# Patient Record
Sex: Male | Born: 1990 | Race: Black or African American | Hispanic: No | Marital: Single | Smoking: Current every day smoker
Health system: Southern US, Community
[De-identification: ages and names within clinical notes are randomized; demographics above are authoritative.]

---
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
## 2008-02-13 ENCOUNTER — Emergency Department (HOSPITAL_COMMUNITY): Admission: EM | Admit: 2008-02-13 | Discharge: 2008-02-13 | Payer: Self-pay | Admitting: Emergency Medicine

## 2009-04-28 ENCOUNTER — Emergency Department (HOSPITAL_COMMUNITY): Admission: EM | Admit: 2009-04-28 | Discharge: 2009-04-28 | Payer: Self-pay | Admitting: Emergency Medicine

## 2009-11-01 ENCOUNTER — Ambulatory Visit (HOSPITAL_COMMUNITY): Admission: RE | Admit: 2009-11-01 | Discharge: 2009-11-01 | Payer: Self-pay | Admitting: Family Medicine

## 2009-11-01 ENCOUNTER — Encounter: Payer: Self-pay | Admitting: Family Medicine

## 2009-11-01 ENCOUNTER — Ambulatory Visit: Payer: Self-pay | Admitting: Family Medicine

## 2009-11-01 DIAGNOSIS — N509 Disorder of male genital organs, unspecified: Secondary | ICD-10-CM | POA: Insufficient documentation

## 2009-11-01 LAB — CONVERTED CEMR LAB
Glucose, Urine, Semiquant: NEGATIVE
Specific Gravity, Urine: >=1.03
pH: 5.5

## 2010-04-04 NOTE — Assessment & Plan Note (Signed)
Summary: swollen scrotum,df   Vital Signs:  Patient profile:   20 year old male Height:      74 inches Weight:      136 pounds BMI:     17.52 Temp:     98.5 degrees F oral Pulse rate:   86 / minute BP sitting:   128 / 78  (left arm) Cuff size:   regular  Vitals Entered By: Jimmy Footman, CMA (November 01, 2009 9:41 AM) CC: swollen scrotum x1 week Is Patient Diabetic? No Pain Assessment Patient in pain? yes     Location: groin Intensity: 10 Type: sharp   CC:  swollen scrotum x1 week.  History of Present Illness: 1. Swollen scrutum - Been there for about a week - He thinks it started after he sat on his left testicle - The pain was a gradual onset and has been getting progressively worse - Pain now rated a 10/10 - Associated with scrotal swelling and tenderness - Any movement or touching hurts - Has been trying to take Ibuprofen but doesn't help  ROS: denies fevers, chills, n/v, penile discharge.  endorses some abdominal pain  Habits & Providers  Alcohol-Tobacco-Diet     Tobacco Status: current  Current Medications (verified): 1)  Doxycycline Hyclate 100 Mg Tabs (Doxycycline Hyclate) .Marland Kitchen.. 1 Tab By Mouth Twice A Day For 10 Days 2)  Hydrocodone-Acetaminophen 5-500 Mg Tabs (Hydrocodone-Acetaminophen) .Marland Kitchen.. 1 Tab By Mouth Every 6 Hours As Needed For Pain  Allergies: No Known Drug Allergies  Social History: Reviewed history from 05/21/2007 and no changes required. Attending [employer] G[employer] program.  Occasional marijauna use (none in past 8 months).  No alcohol or tobacco.  Sexually active with muliple partners.  Lives with mom, dad, and 2 sisters (born in 74 and 4). Enjoys football and basketballSmoking Status:  current  Physical Exam  General:  vitals reviewed.  no acute distress. Lungs:  normal respiratory effort and normal breath sounds.   Heart:  normal rate and regular rhythm.   Abdomen:  soft, non-tender, no distention, and no guarding.   Genitalia:  Left  testicle: grossly swollen compared to the right.  Very tender to palpation.  Boggy. Right testicle: normal Penis: normal Skin:  no rashes and no suspicious lesions.   Psych:  not anxious appearing and not depressed appearing.     Impression & Recommendations:  Problem # 1:  TESTICULAR PAIN, LEFT (ICD-608.9) Assessment New ? Epididymitis.  Will treat with CTX and Doxycycline.  Will also send for testicular ultrasound to make sure that there is no torsion. Orders: GC/Chlamydia-[facility] (87591/87491) Urinalysis-[facility] (00000) Ultrasound (Ultrasound) [facility]- Est  Level 4 (16109)  Complete Medication List: 1)  Doxycycline Hyclate 100 Mg Tabs (Doxycycline hyclate) .Marland Kitchen.. 1 tab by mouth twice a day for 10 days 2)  Hydrocodone-acetaminophen 5-500 Mg Tabs (Hydrocodone-acetaminophen) .Marland Kitchen.. 1 tab by mouth every 6 hours as needed for pain  Patient Instructions: 1)  We will get an ultrasound to make sure that it is not twisted 2)  If it is twisted you will need to go to the Emergency Room for evaluation 3)  I am going to also treat this as if it is an infection 4)  Take all of the antibiotics as prescribed 5)  I have also given you some pain medicines to help with the pain Prescriptions: HYDROCODONE-ACETAMINOPHEN 5-500 MG TABS (HYDROCODONE-ACETAMINOPHEN) 1 tab by mouth every 6 hours as needed for pain  #30 x 0   Entered and Authorized by:  Angelena Sole MD   Signed by:   Angelena Sole MD on 11/01/2009   Method used:   Print then Give to Patient   RxID:   [id number] DOXYCYCLINE HYCLATE 100 MG TABS (DOXYCYCLINE HYCLATE) 1 tab by mouth twice a day for 10 days  #20 x 0   Entered and Authorized by:   Angelena Sole MD   Signed by:   Angelena Sole MD on 11/01/2009   Method used:   Print then Give to Patient   RxID:   [id number]   Laboratory Results   Urine Tests  Date/Time Received: November 01, 2009 10:17 AM  Date/Time Reported: November 01, 2009 10:41 AM   Routine Urinalysis    Color: yellow Appearance: Clear Glucose: negative   (Normal Range: Negative) Bilirubin: negative   (Normal Range: Negative) Ketone: negative   (Normal Range: Negative) Spec. Gravity: >=1.030   (Normal Range: 1.003-1.035) Blood: trace-intact   (Normal Range: Negative) pH: 5.5   (Normal Range: 5.0-8.0) Protein: negative   (Normal Range: Negative) Urobilinogen: 0.2   (Normal Range: 0-1) Nitrite: negative   (Normal Range: Negative) Leukocyte Esterace: small   (Normal Range: Negative)  Urine Microscopic WBC/HPF: TNTC Bacteria/HPF: trace Mucous/HPF: trace Epithelial/HPF: occ    Comments: ...............test performed by......Marland KitchenBonnie A. [place], MLS (ASCP)cm    Pt. left before receiving his shot of 250 mg CTX.  Called and spoke with his mother and told her to tell him to come back to clinic to make sure that he gets his shot.  Angelena Sole MD  November 01, 2009 11:15 AM   Appended Document: injection    Clinical Lists Changes  Orders: Added new Service order of Rocephin  250mg  (Q4696) - Signed       Medication Administration  Injection # 1:    Medication: Rocephin  250mg     Diagnosis: TESTICULAR PAIN, LEFT (ICD-608.9)    Route: IM    Site: RUOQ gluteus    Exp Date: 08/02/2010    Lot #: [id number]    Mfr: [employer]    Given by: Jone Baseman CMA (November 01, 2009 12:09 PM)  Orders Added: 1)  Rocephin  250mg  [L2440]

## 2010-12-24 IMAGING — US US SCROTUM
1 series · 14 of 25 positions shown · non-contrast
Comparison: None available.

CLINICAL DATA: Left testicular pain and swelling.

SCROTAL ULTRASOUND
DOPPLER ULTRASOUND OF THE TESTICLES
TECHNIQUE: Complete ultrasound examination of the testicles,
epididymis, and other scrotal structures was performed.  Color and
spectral Doppler ultrasound were also utilized to evaluate blood
flow to the testicles.

[Series 1: us scrotum · 0.08mm/px · 14 of 57 slices shown]
[im 1/57]
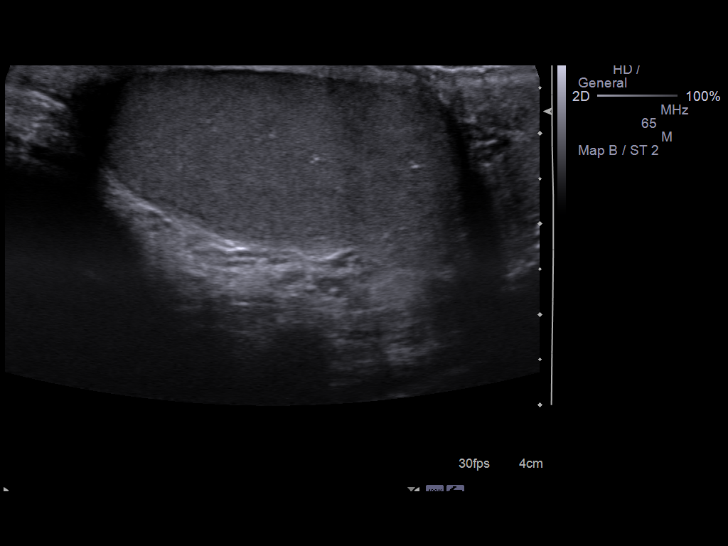
[im 5/57]
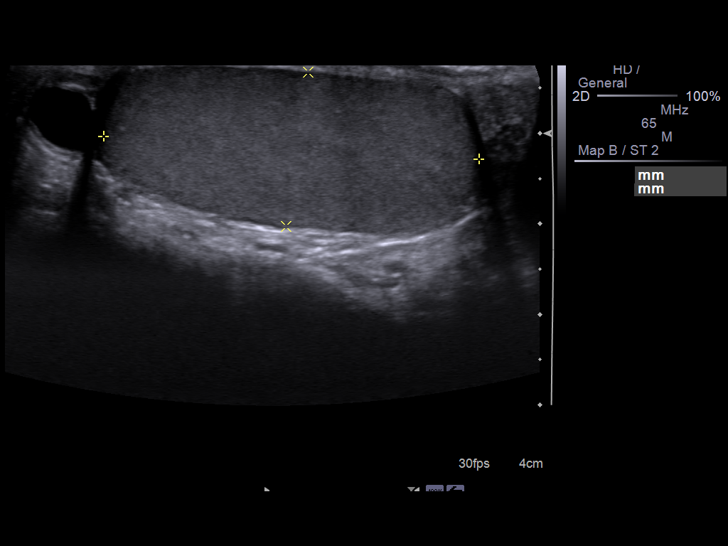
[im 10/57]
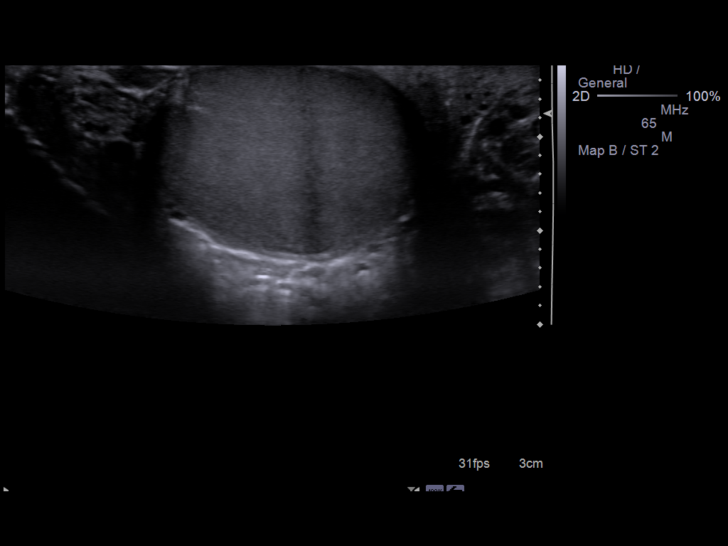
[im 15/57]
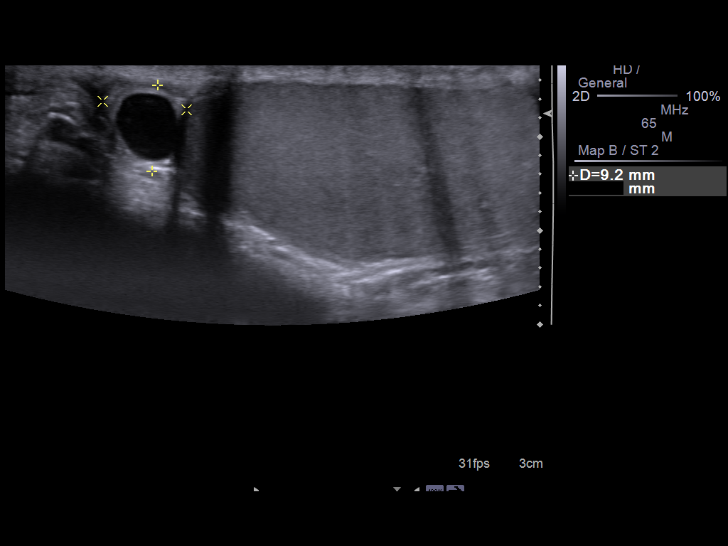
[im 19/57]
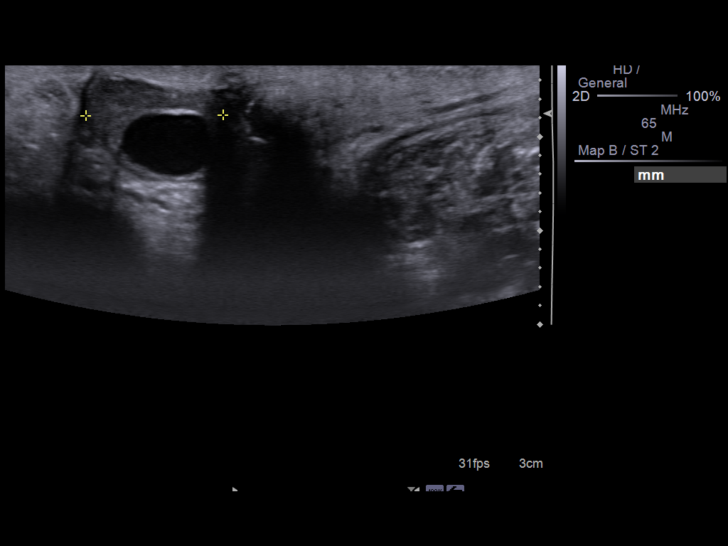
[im 22/57]
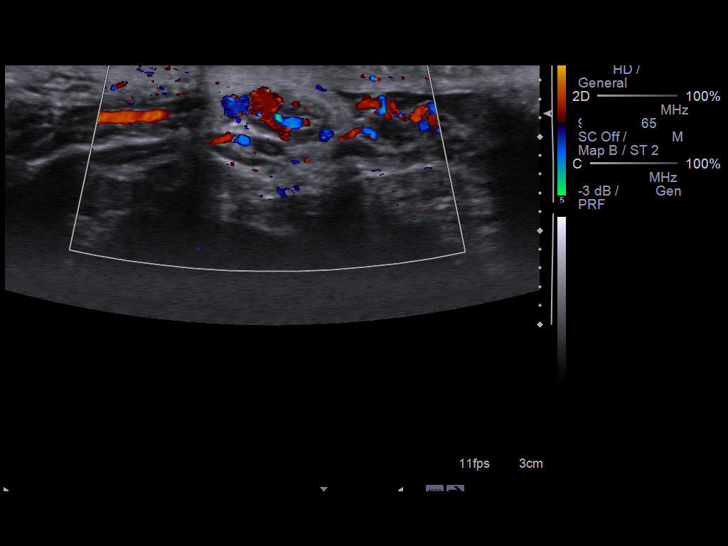
[im 26/57]
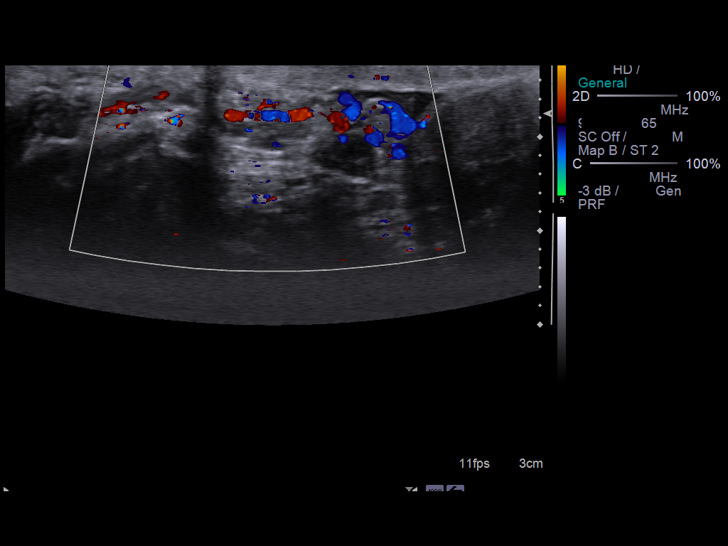
[im 31/57]
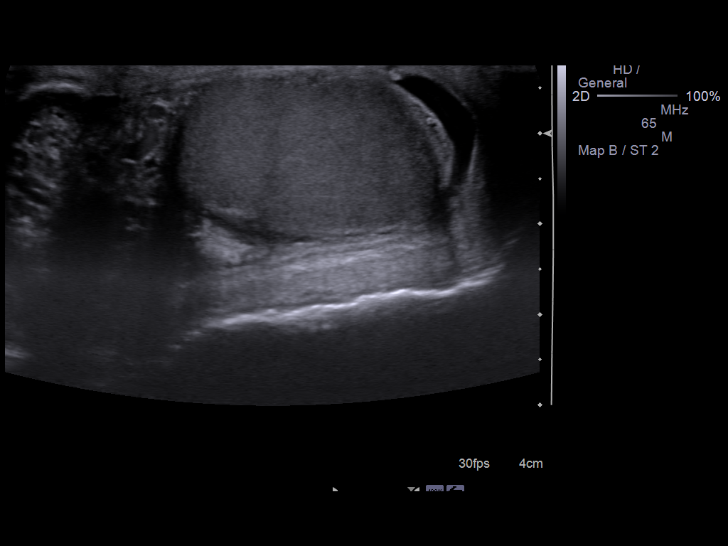
[im 36/57]
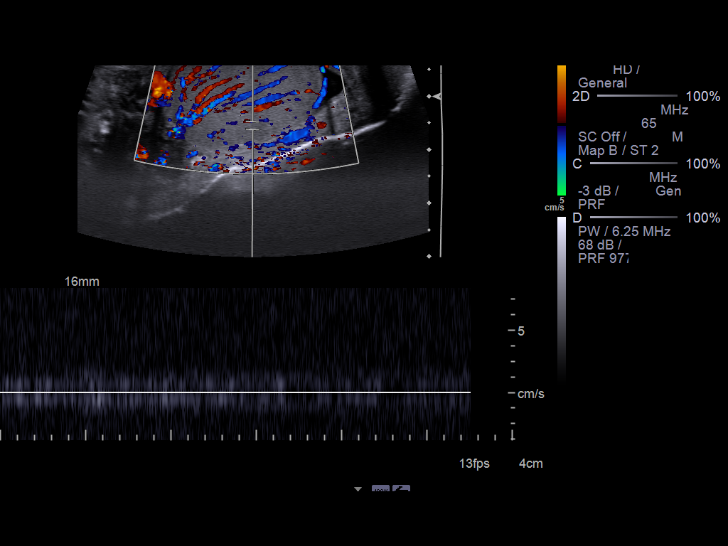
[im 38/57]
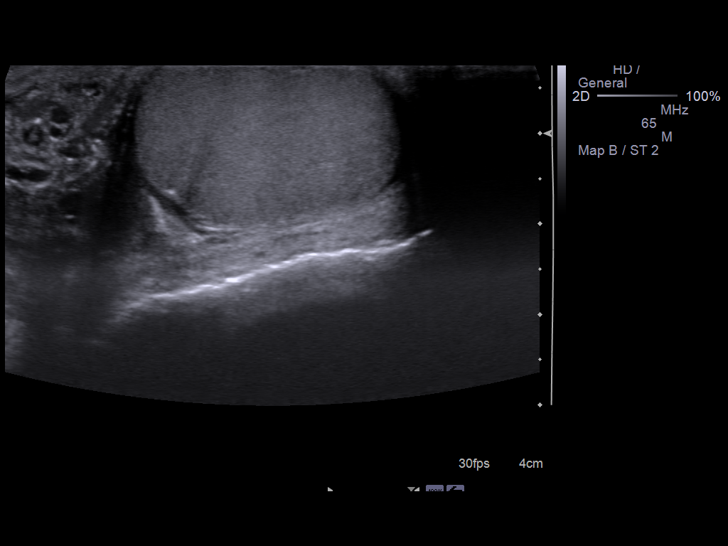
[im 43/57]
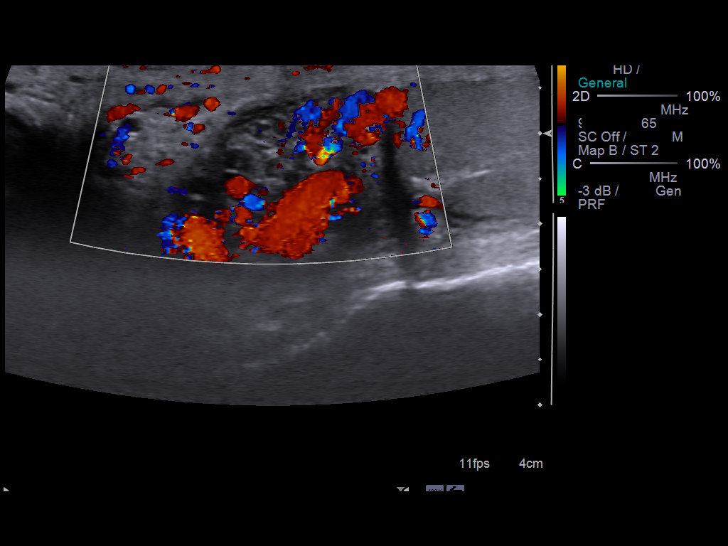
[im 47/57]
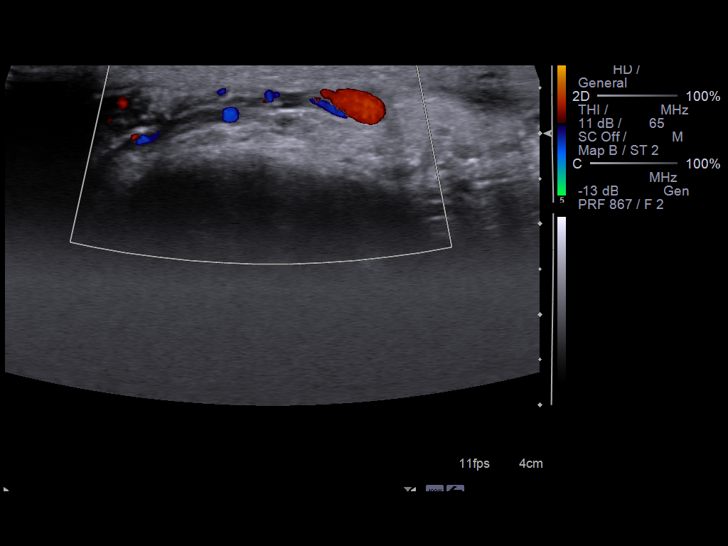
[im 52/57]
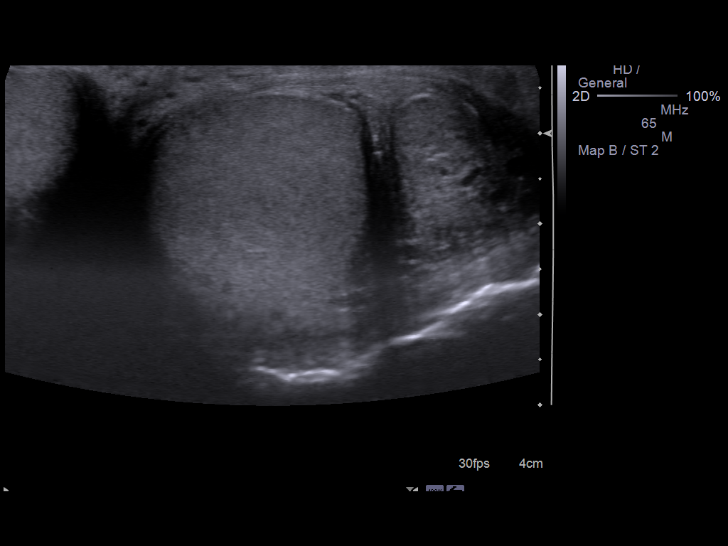
[im 57/57]
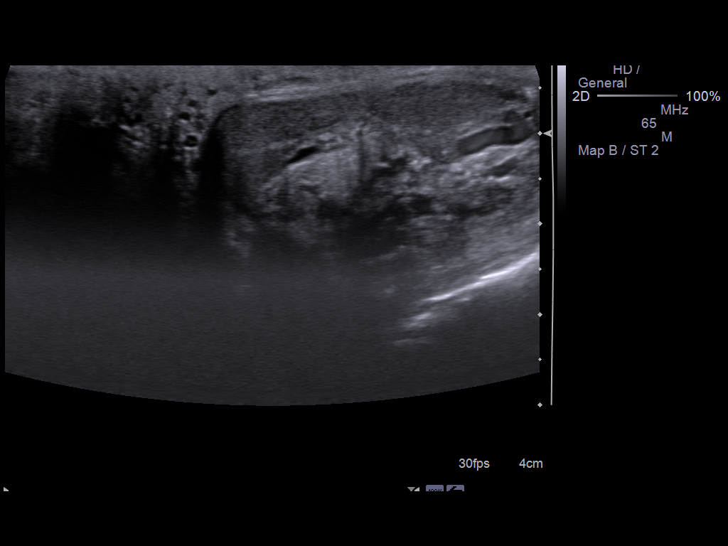

[14 of 25 positions shown; findings below may reference images not displayed]

FINDINGS: The testicles are symmetric in size and echogenicity.
No testicular masses are seen, and there is no evidence of
microlithiasis.

A 1 cm cyst or spermatocele is seen in the head of the right
epididymis.  No definite abnormality of the left epididymis seen.
The left epididymis is surrounded by a large left sided varicocele.
A small varicocele is noted in the right.  These show increased
blood flow during Valsalva.

Blood flow is seen within both testicles on color Doppler
sonography.  Doppler spectral waveforms show both arterial and
venous flow signal in both testicles.
IMPRESSION: 1.  No evidence of testicular mass or torsion.
2.  Bilateral varicoceles, left side larger than right.
3.  1 cm cyst or spermatocele in the right epididymal head.

## 2019-01-19 ENCOUNTER — Other Ambulatory Visit: Payer: Self-pay

## 2019-01-19 DIAGNOSIS — Z20822 Contact with and (suspected) exposure to covid-19: Secondary | ICD-10-CM

## 2019-01-21 LAB — NOVEL CORONAVIRUS, NAA: SARS-CoV-2, NAA: NOT DETECTED

## 2019-04-26 ENCOUNTER — Ambulatory Visit (HOSPITAL_COMMUNITY)
Admission: EM | Admit: 2019-04-26 | Discharge: 2019-04-26 | Disposition: A | Discharge status: Home / Self Care | Payer: Self-pay | Attending: Family Medicine | Admitting: Family Medicine

## 2019-04-26 ENCOUNTER — Other Ambulatory Visit: Payer: Self-pay

## 2019-04-26 ENCOUNTER — Encounter (HOSPITAL_COMMUNITY): Payer: Self-pay | Admitting: Family Medicine

## 2019-04-26 DIAGNOSIS — M62830 Muscle spasm of back: Secondary | ICD-10-CM

## 2019-04-26 DIAGNOSIS — R030 Elevated blood-pressure reading, without diagnosis of hypertension: Secondary | ICD-10-CM

## 2019-04-26 MED ORDER — CYCLOBENZAPRINE HCL 10 MG PO TABS: ORAL_TABLET | ORAL | 0 refills | Status: AC

## 2019-04-26 MED ORDER — DICLOFENAC SODIUM 75 MG PO TBEC: 75.0000 mg | DELAYED_RELEASE_TABLET | Freq: Two times a day (BID) | ORAL | 0 refills | Status: AC

## 2019-04-26 NOTE — Discharge Instructions (Signed)
HOME CARE INSTRUCTIONS: For many people, back pain returns. Since low back pain is rarely dangerous, it is often a condition that people can learn to manage on their own. Please remain active. It is stressful on the back to sit or stand in one place. Do not sit, drive, or stand in one place for more than 30 minutes at a time. Take short walks on level surfaces as soon as pain allows. Try to increase the length of time you walk each day. Do not stay in bed. Resting more than 1 or 2 days can delay your recovery. Do not avoid exercise or work. Your body is made to move. It is not dangerous to be active, even though your back may hurt. Your back will likely heal faster if you return to being active before your pain is gone. Over-the-counter medicines to reduce pain and inflammation are often the most helpful.  SEEK MEDICAL CARE IF: You have pain that is not relieved with rest or medicine. You have pain that does not improve in 1 week. You have new symptoms. You are generally not feeling well.  SEEK IMMEDIATE MEDICAL CARE IF: You have pain that radiates from your back into your legs. You develop new bowel or bladder control problems. You have unusual weakness or numbness in your arms or legs. You develop nausea or vomiting. You develop abdominal pain. You feel faint.  Your blood pressure was noted to be borderline high during your visit today. You may return here within the next few days to recheck if unable to see your primary care doctor. If your blood pressure remains persistently elevated, you may need to begin taking a medication.  BP (!) 143/84 (BP Location: Left Arm)   Pulse 78   Temp 98.7 F (37.1 C) (Oral)   Resp 16   SpO2 99%
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
